# Patient Record
Sex: Male | Born: 1990 | Race: White | Hispanic: No | Smoking: Former smoker
Health system: Southern US, Community
[De-identification: ages and names within clinical notes are randomized; demographics above are authoritative.]

## PROBLEM LIST (undated history)

## (undated) DIAGNOSIS — Z7289 Other problems related to lifestyle: Secondary | ICD-10-CM

## (undated) DIAGNOSIS — F32A Depression, unspecified: Secondary | ICD-10-CM

## (undated) DIAGNOSIS — F1090 Alcohol use, unspecified, uncomplicated: Secondary | ICD-10-CM

## (undated) DIAGNOSIS — F419 Anxiety disorder, unspecified: Secondary | ICD-10-CM

## (undated) DIAGNOSIS — F1290 Cannabis use, unspecified, uncomplicated: Secondary | ICD-10-CM

## (undated) DIAGNOSIS — F329 Major depressive disorder, single episode, unspecified: Secondary | ICD-10-CM

## (undated) DIAGNOSIS — S0990XS Unspecified injury of head, sequela: Secondary | ICD-10-CM

## (undated) DIAGNOSIS — G44309 Post-traumatic headache, unspecified, not intractable: Secondary | ICD-10-CM

## (undated) HISTORY — DX: Post-traumatic headache, unspecified, not intractable: G44.309

## (undated) HISTORY — DX: Cannabis use, unspecified, uncomplicated: F12.90

## (undated) HISTORY — DX: Major depressive disorder, single episode, unspecified: F32.9

## (undated) HISTORY — DX: Anxiety disorder, unspecified: F41.9

## (undated) HISTORY — DX: Alcohol use, unspecified, uncomplicated: F10.90

## (undated) HISTORY — DX: Depression, unspecified: F32.A

## (undated) HISTORY — DX: Other problems related to lifestyle: Z72.89

## (undated) HISTORY — DX: Post-traumatic headache, unspecified, not intractable: S09.90XS

## (undated) HISTORY — PX: TYMPANIC MEMBRANE REPAIR: SHX294

---
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
## 2015-05-30 ENCOUNTER — Encounter: Payer: Self-pay | Admitting: Family Medicine

## 2015-05-30 ENCOUNTER — Ambulatory Visit (INDEPENDENT_AMBULATORY_CARE_PROVIDER_SITE_OTHER): Payer: BLUE CROSS/BLUE SHIELD | Admitting: Family Medicine

## 2015-05-30 VITALS — BP 120/74 | HR 98 | Temp 98.7°F | Ht 67.0 in | Wt 176.4 lb

## 2015-05-30 DIAGNOSIS — R05 Cough: Secondary | ICD-10-CM

## 2015-05-30 DIAGNOSIS — E663 Overweight: Secondary | ICD-10-CM | POA: Diagnosis not present

## 2015-05-30 DIAGNOSIS — F329 Major depressive disorder, single episode, unspecified: Secondary | ICD-10-CM

## 2015-05-30 DIAGNOSIS — F419 Anxiety disorder, unspecified: Principal | ICD-10-CM | POA: Insufficient documentation

## 2015-05-30 DIAGNOSIS — F418 Other specified anxiety disorders: Secondary | ICD-10-CM | POA: Diagnosis not present

## 2015-05-30 DIAGNOSIS — F32A Depression, unspecified: Secondary | ICD-10-CM

## 2015-05-30 DIAGNOSIS — R059 Cough, unspecified: Secondary | ICD-10-CM

## 2015-05-30 LAB — HEMOGLOBIN A1C: HEMOGLOBIN A1C: 5.2 % (ref 4.6–6.5)

## 2015-05-30 LAB — LIPID PANEL
Cholesterol: 157 mg/dL (ref 0–200)
HDL: 48.1 mg/dL
LDL Cholesterol: 87 mg/dL (ref 0–99)
NonHDL: 108.98
Total CHOL/HDL Ratio: 3
Triglycerides: 112 mg/dL (ref 0.0–149.0)
VLDL: 22.4 mg/dL (ref 0.0–40.0)

## 2015-05-30 LAB — COMPREHENSIVE METABOLIC PANEL WITH GFR
ALT: 24 U/L (ref 0–53)
AST: 19 U/L (ref 0–37)
Albumin: 4.5 g/dL (ref 3.5–5.2)
Alkaline Phosphatase: 74 U/L (ref 39–117)
BUN: 15 mg/dL (ref 6–23)
CO2: 29 meq/L (ref 19–32)
Calcium: 9.6 mg/dL (ref 8.4–10.5)
Chloride: 107 meq/L (ref 96–112)
Creatinine, Ser: 0.91 mg/dL (ref 0.40–1.50)
GFR: 107.52 mL/min
Glucose, Bld: 100 mg/dL — ABNORMAL HIGH (ref 70–99)
Potassium: 4 meq/L (ref 3.5–5.1)
Sodium: 143 meq/L (ref 135–145)
Total Bilirubin: 0.3 mg/dL (ref 0.2–1.2)
Total Protein: 7 g/dL (ref 6.0–8.3)

## 2015-05-30 MED ORDER — SERTRALINE HCL 50 MG PO TABS: ORAL_TABLET | ORAL | Status: DC

## 2015-05-30 NOTE — Patient Instructions (Signed)
Nice to meet you. We will start you on Zoloft for your anxiety and depression. I would also recommend you seeing a therapist. If you would like help stopping smoking marijuana please contact the [facility] at [phone]. If you develop thoughts of harming herself or others or worsening anxiety or depression please seek medical attention.

## 2015-05-30 NOTE — Assessment & Plan Note (Signed)
Suspect this is related to allergies. Benign lung exam. Has signs of mild postnasal drip. Patient will continue to monitor. If worsens would consider starting antihistamine.
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
## 2015-05-30 NOTE — Progress Notes (Signed)
Pre visit review using our clinic review tool, if applicable. No additional management support is needed unless otherwise documented below in the visit note. 

## 2015-05-31 ENCOUNTER — Encounter: Payer: Self-pay | Admitting: Family Medicine

## 2015-06-28 ENCOUNTER — Encounter: Payer: BLUE CROSS/BLUE SHIELD | Admitting: Family Medicine

## 2016-05-05 ENCOUNTER — Encounter: Payer: Self-pay | Admitting: Family Medicine

## 2016-05-05 ENCOUNTER — Ambulatory Visit (INDEPENDENT_AMBULATORY_CARE_PROVIDER_SITE_OTHER): Payer: BLUE CROSS/BLUE SHIELD | Admitting: Family Medicine

## 2016-05-05 DIAGNOSIS — F418 Other specified anxiety disorders: Secondary | ICD-10-CM | POA: Diagnosis not present

## 2016-05-05 DIAGNOSIS — F329 Major depressive disorder, single episode, unspecified: Secondary | ICD-10-CM

## 2016-05-05 DIAGNOSIS — F419 Anxiety disorder, unspecified: Principal | ICD-10-CM

## 2016-05-05 MED ORDER — FLUOXETINE HCL 20 MG PO TABS: 20.0000 mg | ORAL_TABLET | Freq: Every day | ORAL | 3 refills | Status: DC

## 2016-05-05 NOTE — Progress Notes (Signed)
  Todd AlarEric Jovian Lembcke, MD Phone: [phone]  Todd Tanner is a 26 y.o. male who presents today for follow-up.  Patient notes worsening anxiety and depression over the last month or so. It feels like he has gone down into a hole. He notes some PTSD-like symptoms with anxiety worsened by loud noises and some mild flashbacks with this. Gets excited when this occurs as well. Just feels depressed. No SI or HI. He improved with Zoloft though this did cause his energy level to go down. Had a similar reaction with Effexor in the past.  Patient is also potentially interested in a vasectomy in the future. They are about to have their third child. He is not ready for the vasectomy yet.  ROS see history of present illness  Objective  Physical Exam Vitals:   05/05/16 1508  BP: 130/82  Pulse: 84  Temp: 98.1 F (36.7 C)    BP Readings from Last 3 Encounters:  05/05/16 130/82  05/30/15 120/74   Wt Readings from Last 3 Encounters:  05/05/16 170 lb 3.2 oz (77.2 kg)  05/30/15 176 lb 6.4 oz (80 kg)    Physical Exam  Constitutional: No distress.  Cardiovascular: Normal rate, regular rhythm and normal heart sounds.   Pulmonary/Chest: Effort normal and breath sounds normal.  Neurological: He is alert. Gait normal.  Skin: He is not diaphoretic.  Psychiatric:  Mood depressed, affect flat     Assessment/Plan: Please see individual problem list.  Anxiety and depression Symptoms of worsened over the last month or so. He discontinued the Zoloft about a month after starting him previously. He is interested in medication for this. Not interested in a therapist or counselor at this time. No SI or HI. We'll start on Prozac. Follow-up in 2 months. Given return precautions.  Patient will contact us when he is ready for urology referral for vasectomy.  No orders of the defined types were placed in this encounter.   Meds ordered this encounter  Medications  . FLUoxetine (PROZAC) 20 MG tablet   Sig: Take 1 tablet (20 mg total) by mouth daily.    Dispense:  30 tablet    Refill:  3    Todd AlarEric Gil Ingwersen, MD [facility]

## 2016-05-05 NOTE — Patient Instructions (Signed)
Nice to see you. We are going to start you on Prozac. Please take this daily. If you develop thoughts of harming your self or others please seek medical attention immediately. When you are ready to consider the vasectomy please let us know.

## 2016-05-05 NOTE — Progress Notes (Signed)
Pre visit review using our clinic review tool, if applicable. No additional management support is needed unless otherwise documented below in the visit note. 

## 2016-05-05 NOTE — Assessment & Plan Note (Addendum)
Symptoms have worsened over the last month or so. He discontinued the Zoloft about a month after starting it previously. He is interested in medication for this. Not interested in a therapist or counselor at this time. No SI or HI. We'll start on Prozac. Follow-up in 2 months. Given return precautions.

## 2016-06-02 ENCOUNTER — Telehealth: Payer: Self-pay | Admitting: Family Medicine

## 2016-06-02 MED ORDER — FLUOXETINE HCL 20 MG PO CAPS: 20.0000 mg | ORAL_CAPSULE | Freq: Every day | ORAL | 3 refills | Status: DC

## 2016-06-02 NOTE — Telephone Encounter (Signed)
Please advise 

## 2016-06-02 NOTE — Telephone Encounter (Signed)
Capsule sent to pharmacy 

## 2016-06-02 NOTE — Telephone Encounter (Signed)
Pt wife called about pt medication of FLUoxetine (PROZAC) 20 MG tablet if possible can be switched to capsule? Pt does not have insurance right now due to switching jobs.   [facility] [address] - [place], [place] - [id number]  Call pt @ [phone]. Thank you!

## 2016-07-04 ENCOUNTER — Ambulatory Visit: Payer: BLUE CROSS/BLUE SHIELD | Admitting: Family Medicine

## 2018-11-09 ENCOUNTER — Other Ambulatory Visit: Payer: Self-pay

## 2018-11-09 ENCOUNTER — Ambulatory Visit: Payer: Self-pay

## 2018-11-09 DIAGNOSIS — Z113 Encounter for screening for infections with a predominantly sexual mode of transmission: Secondary | ICD-10-CM

## 2018-11-09 DIAGNOSIS — Z202 Contact with and (suspected) exposure to infections with a predominantly sexual mode of transmission: Secondary | ICD-10-CM

## 2018-11-09 LAB — GRAM STAIN

## 2018-11-09 MED ORDER — AZITHROMYCIN 500 MG PO TABS
1000.0000 mg | ORAL_TABLET | Freq: Once | ORAL | Status: AC
  Administered 2018-11-09: 1000 mg via ORAL

## 2018-11-09 NOTE — Progress Notes (Signed)
Gram stain reviewed, pt treated per provider orders for contact to Chlamydia. Provider orders completed.

## 2018-11-09 NOTE — Progress Notes (Signed)
    STI clinic/screening visit  Subjective:  Todd Tanner is a 28 y.o. male being seen today for an STI screening visit. The patient reports they do not have symptoms.  Patient has the following medical conditionshas Anxiety and depression and Cough on their problem list.  Chief Complaint  Patient presents with  . SEXUALLY TRANSMITTED DISEASE    desires STD checks, states he is a contact to chlamydia    Patient reports  Client states he is a contact to chlamydia. States that partner was treated about 7 days ago.    See flowsheet for further details and programmatic requirements.    The following portions of the patient's history were reviewed and updated as appropriate: allergies, current medications, past family history, past medical history, past social history, past surgical history and problem list. Problem list updated.  Objective:  There were no vitals filed for this visit.  Physical Exam Constitutional:      Appearance: Normal appearance.  Neck:     Musculoskeletal: Neck supple.  Genitourinary:    Penis: Normal.      Scrotum/Testes: Normal.     Comments: Neg. adenopathy Lymphadenopathy:     Cervical: No cervical adenopathy.  Skin:    General: Skin is dry.     Findings: No lesion or rash.       Assessment and Plan:  Todd Tanner is a 28 y.o. male presenting to the [facility] for STI screening  1. Screening for venereal disease - Gonococcus culture - Gram stain - HIV [facility] - Syphilis Serology, [facility]  2. Screening examination for venereal disease Treat as contact to Chlamydia as per  SO. Condoms always     No follow-ups on file.  No future appointments.  Hassell Done, FNP

## 2018-11-14 LAB — GONOCOCCUS CULTURE

## 2019-02-21 ENCOUNTER — Ambulatory Visit: Payer: Self-pay

## 2019-04-09 ENCOUNTER — Encounter: Payer: Self-pay | Admitting: Emergency Medicine

## 2019-04-09 ENCOUNTER — Emergency Department
Admission: EM | Admit: 2019-04-09 | Discharge: 2019-04-09 | Disposition: A | Discharge status: Home / Self Care | Payer: BLUE CROSS/BLUE SHIELD | Attending: Emergency Medicine | Admitting: Emergency Medicine

## 2019-04-09 ENCOUNTER — Other Ambulatory Visit: Payer: Self-pay

## 2019-04-09 DIAGNOSIS — W228XXA Striking against or struck by other objects, initial encounter: Secondary | ICD-10-CM | POA: Insufficient documentation

## 2019-04-09 DIAGNOSIS — Z23 Encounter for immunization: Secondary | ICD-10-CM | POA: Insufficient documentation

## 2019-04-09 DIAGNOSIS — Z79899 Other long term (current) drug therapy: Secondary | ICD-10-CM | POA: Insufficient documentation

## 2019-04-09 DIAGNOSIS — Y9289 Other specified places as the place of occurrence of the external cause: Secondary | ICD-10-CM | POA: Insufficient documentation

## 2019-04-09 DIAGNOSIS — Y990 Civilian activity done for income or pay: Secondary | ICD-10-CM | POA: Insufficient documentation

## 2019-04-09 DIAGNOSIS — Y9389 Activity, other specified: Secondary | ICD-10-CM | POA: Insufficient documentation

## 2019-04-09 DIAGNOSIS — S0502XA Injury of conjunctiva and corneal abrasion without foreign body, left eye, initial encounter: Secondary | ICD-10-CM | POA: Insufficient documentation

## 2019-04-09 MED ORDER — TETANUS-DIPHTH-ACELL PERTUSSIS 5-2.5-18.5 LF-MCG/0.5 IM SUSP
0.5000 mL | Freq: Once | INTRAMUSCULAR | Status: AC
  Administered 2019-04-09: 0.5 mL via INTRAMUSCULAR
  Filled 2019-04-09: qty 0.5

## 2019-04-09 MED ORDER — KETOROLAC TROMETHAMINE 0.5 % OP SOLN: 1.0000 [drp] | Freq: Four times a day (QID) | OPHTHALMIC | 0 refills | Status: DC

## 2019-04-09 MED ORDER — FLUORESCEIN SODIUM 1 MG OP STRP
1.0000 | ORAL_STRIP | Freq: Once | OPHTHALMIC | Status: AC
  Administered 2019-04-09: 17:00:00 1 via OPHTHALMIC
  Filled 2019-04-09: qty 1

## 2019-04-09 MED ORDER — TETRACAINE HCL 0.5 % OP SOLN
2.0000 [drp] | Freq: Once | OPHTHALMIC | Status: AC
  Administered 2019-04-09: 17:00:00 2 [drp] via OPHTHALMIC
  Filled 2019-04-09: qty 4

## 2019-04-09 MED ORDER — ERYTHROMYCIN 5 MG/GM OP OINT: 1.0000 "application " | TOPICAL_OINTMENT | Freq: Three times a day (TID) | OPHTHALMIC | 1 refills | Status: DC

## 2019-04-09 NOTE — Discharge Instructions (Signed)
Follow-up with [facility] on Monday morning.  Please call for an appointment.  Tell them you were seen in the emergency department, that we talked to Dr. Wallace Going, and you have a corneal abrasion that needs to be evaluated.  Use the eye ointment as prescribed.  Keralac ophthalmic drops as prescribed.  You may also take over-the-counter Tylenol or ibuprofen.  Wear sunglasses when in bright lights.

## 2019-04-09 NOTE — ED Notes (Signed)
Pt calling employer regarding WC required documentation.
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
## 2019-04-09 NOTE — ED Notes (Signed)
Pt contacted employer. Pt st "Ithey do not need UA/blood".

## 2019-04-09 NOTE — ED Triage Notes (Signed)
Pt here for left eye injury.  Opened a paper bag at work and it scraped left eye.  No pain. Some blurry vision.  Would like to file workers comp; works at [employer] in [place].

## 2023-07-08 ENCOUNTER — Other Ambulatory Visit: Payer: Self-pay

## 2023-07-08 DIAGNOSIS — Z87820 Personal history of traumatic brain injury: Secondary | ICD-10-CM | POA: Insufficient documentation

## 2023-07-08 DIAGNOSIS — F439 Reaction to severe stress, unspecified: Secondary | ICD-10-CM | POA: Insufficient documentation

## 2023-07-08 DIAGNOSIS — R945 Abnormal results of liver function studies: Secondary | ICD-10-CM | POA: Insufficient documentation

## 2023-07-08 DIAGNOSIS — R454 Irritability and anger: Secondary | ICD-10-CM | POA: Insufficient documentation

## 2023-07-08 MED ORDER — AMOXICILLIN-POT CLAVULANATE 875-125 MG PO TABS
1.0000 | ORAL_TABLET | Freq: Two times a day (BID) | ORAL | 0 refills | Status: DC
  Filled 2023-07-08: qty 20, 10d supply, fill #0

## 2024-02-17 ENCOUNTER — Encounter: Payer: Self-pay | Admitting: Psychiatry

## 2024-02-17 ENCOUNTER — Ambulatory Visit (INDEPENDENT_AMBULATORY_CARE_PROVIDER_SITE_OTHER): Admitting: Psychiatry

## 2024-02-17 ENCOUNTER — Other Ambulatory Visit: Payer: Self-pay

## 2024-02-17 VITALS — BP 126/79 | HR 80 | Temp 97.5°F | Ht 67.0 in | Wt 150.4 lb

## 2024-02-17 DIAGNOSIS — F411 Generalized anxiety disorder: Secondary | ICD-10-CM

## 2024-02-17 DIAGNOSIS — Z79899 Other long term (current) drug therapy: Secondary | ICD-10-CM

## 2024-02-17 DIAGNOSIS — Z87820 Personal history of traumatic brain injury: Secondary | ICD-10-CM | POA: Diagnosis not present

## 2024-02-17 DIAGNOSIS — F4310 Post-traumatic stress disorder, unspecified: Secondary | ICD-10-CM

## 2024-02-17 MED ORDER — FLUOXETINE HCL 10 MG PO CAPS: 10.0000 mg | ORAL_CAPSULE | Freq: Every day | ORAL | 1 refills | Status: AC
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
## 2024-02-17 NOTE — Patient Instructions (Signed)
Fluoxetine Capsules or Tablets (Depression/Mood Disorders) What is this medication? FLUOXETINE (floo OX e teen) treats depression, anxiety, obsessive-compulsive disorder (OCD), and eating disorders. It increases the amount of serotonin in the brain, a hormone that helps regulate mood. It belongs to a group of medications called SSRIs. This medicine may be used for other purposes; ask your health care provider or pharmacist if you have questions. COMMON BRAND NAME(S): Prozac What should I tell my care team before I take this medication? They need to know if you have any of these conditions: Bipolar disorder or a family history of bipolar disorder Bleeding disorder Glaucoma Heart disease Liver disease Low levels of sodium in the blood Seizures Suicidal thoughts, plans, or attempt by you or a family member Take an MAOI, such as Carbex, Eldepryl, Marplan, Nardil, or Parnate Take medications that treat or prevent blood clots Thyroid disease An unusual or allergic reaction to fluoxetine, other medications, foods, dyes, or preservatives Pregnant or trying to get pregnant Breastfeeding How should I use this medication? Take this medication by mouth with a glass of water. Follow the directions on the prescription label. You can take this medication with or without food. Take your medication at regular intervals. Do not take it more often than directed. Do not stop taking this medication suddenly except upon the advice of your care team. Stopping this medication too quickly may cause serious side effects or your condition may worsen. A special MedGuide will be given to you by the pharmacist with each prescription and refill. Be sure to read this information carefully each time. Talk to your care team about the use of this medication in children. While it may be prescribed for children as young as 7 years for selected conditions, precautions do apply. Overdosage: If you think you have taken too much of  this medicine contact a poison control center or emergency room at once. NOTE: This medicine is only for you. Do not share this medicine with others. What if I miss a dose? If you miss a dose, skip the missed dose and go back to your regular dosing schedule. Do not take double or extra doses. What may interact with this medication? Do not take this medication with any of the following: Other medications containing fluoxetine, such as Sarafem or Symbyax Cisapride Dronedarone Linezolid MAOIs, such as Carbex, Eldepryl, Marplan, Nardil, and Parnate Methylene blue (injected into a vein) Pimozide Thioridazine This medication may also interact with the following: Alcohol Amphetamines Aspirin and aspirin-like medications Carbamazepine Certain medications for mental health conditions Certain medications for migraine headache, such as almotriptan, eletriptan, frovatriptan, naratriptan, rizatriptan, sumatriptan, zolmitriptan Digoxin Diuretics Fentanyl Flecainide Furazolidone Isoniazid Lithium Medications that help you fall asleep Medications that treat or prevent blood clots, such as warfarin, enoxaparin, and dalteparin NSAIDs, medications for pain and inflammation, such as ibuprofen or naproxen Other medications that cause heart rhythm changes Phenytoin Procarbazine Propafenone Rasagiline Ritonavir Supplements, such as St. John's wort, kava kava, valerian Tramadol Tryptophan Vinblastine This list may not describe all possible interactions. Give your health care provider a list of all the medicines, herbs, non-prescription drugs, or dietary supplements you use. Also tell them if you smoke, drink alcohol, or use illegal drugs. Some items may interact with your medicine. What should I watch for while using this medication? Tell your care team if your symptoms do not get better or if they get worse. Visit your care team for regular checks on your progress. Because it may take several  weeks to see  the full effects of this medication, it is important to continue your treatment as prescribed. Watch for new or worsening thoughts of suicide or depression. This includes sudden changes in mood, behavior, or thoughts. These changes can happen at any time but are more common in the beginning of treatment or after a change in dose. Call your care team right away if you experience these thoughts or worsening depression. This medication may cause mood and behavior changes, such as anxiety, nervousness, irritability, hostility, restlessness, excitability, hyperactivity, or trouble sleeping. These changes can happen at any time but are more common in the beginning of treatment or after a change in dose. Call your care team right away if you notice any of these symptoms. This medication may affect your coordination, reaction time, or judgment. Do not drive or operate machinery until you know how this medication affects you. Sit up or stand slowly to reduce the risk of dizzy or fainting spells. Drinking alcohol with this medication can increase the risk of these side effects. Your mouth may get dry. Chewing sugarless gum or sucking hard candy and drinking plenty of water may help. Contact your care team if the problem does not go away or is severe. This medication may affect blood sugar levels. If you have diabetes, check with your care team before you make changes to your diet or medications. What side effects may I notice from receiving this medication? Side effects that you should report to your care team as soon as possible: Allergic reactions--skin rash, itching, hives, swelling of the face, lips, tongue, or throat Bleeding--bloody or black, tar-like stools, red or dark brown urine, vomiting blood or brown material that looks like coffee grounds, small, red or purple spots on skin, unusual bleeding or bruising Heart rhythm changes--fast or irregular heartbeat, dizziness, feeling faint or  lightheaded, chest pain, trouble breathing Loss of appetite with weight loss Low sodium level--muscle weakness, fatigue, dizziness, headache, confusion Serotonin syndrome--irritability, confusion, fast or irregular heartbeat, muscle stiffness, twitching muscles, sweating, high fever, seizure, chills, vomiting, diarrhea Sudden eye pain or change in vision such as blurry vision, seeing halos around lights, vision loss Thoughts of suicide or self-harm, worsening mood, feelings of depression Side effects that usually do not require medical attention (report to your care team if they continue or are bothersome): Anxiety, nervousness Change in sex drive or performance Diarrhea Dry mouth Headache Excessive sweating Nausea Tremors or shaking Trouble sleeping Upset stomach This list may not describe all possible side effects. Call your doctor for medical advice about side effects. You may report side effects to FDA at 1-800-FDA-1088. Where should I keep my medication? Keep out of the reach of children and pets. Store at room temperature between 15 and 30 degrees C (59 and 86 degrees F). Get rid of any unused medication after the expiration date. NOTE: This sheet is a summary. It may not cover all possible information. If you have questions about this medicine, talk to your doctor, pharmacist, or health care provider.  2024 Elsevier/Gold Standard (2022-01-21 00:00:00)

## 2024-02-18 ENCOUNTER — Encounter: Payer: Self-pay | Admitting: Psychiatry

## 2024-03-30 ENCOUNTER — Telehealth (INDEPENDENT_AMBULATORY_CARE_PROVIDER_SITE_OTHER): Admitting: Psychiatry

## 2024-03-30 ENCOUNTER — Telehealth: Payer: Self-pay | Admitting: Psychiatry

## 2024-03-30 DIAGNOSIS — Z87820 Personal history of traumatic brain injury: Secondary | ICD-10-CM

## 2024-03-30 DIAGNOSIS — F411 Generalized anxiety disorder: Secondary | ICD-10-CM

## 2024-03-30 DIAGNOSIS — F4310 Post-traumatic stress disorder, unspecified: Secondary | ICD-10-CM

## 2024-03-30 NOTE — Progress Notes (Signed)
Patient did not show up for his appointment

## 2024-03-30 NOTE — Telephone Encounter (Signed)
 Disregard note.

## 2024-04-27 ENCOUNTER — Telehealth: Admitting: Psychiatry
